# Patient Record
Sex: Male | Born: 2000 | Race: White | Hispanic: No | Marital: Single
Health system: Southern US, Community
[De-identification: ages and names within clinical notes are randomized; demographics above are authoritative.]

---
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
## 2021-02-23 ENCOUNTER — Other Ambulatory Visit: Payer: Self-pay

## 2021-02-23 MED ORDER — DOXYCYCLINE HYCLATE 100 MG PO CAPS
100.0000 mg | ORAL_CAPSULE | Freq: Two times a day (BID) | ORAL | 0 refills | Status: AC
  Filled 2021-02-23: qty 14, 7d supply, fill #0

## 2021-03-09 ENCOUNTER — Other Ambulatory Visit: Payer: Self-pay

## 2021-03-12 ENCOUNTER — Other Ambulatory Visit: Payer: Self-pay

## 2021-03-12 MED ORDER — CARESTART COVID-19 HOME TEST VI KIT
PACK | 0 refills | Status: AC
  Filled 2021-03-12: qty 2, 4d supply, fill #0

## 2021-03-25 ENCOUNTER — Other Ambulatory Visit: Payer: Self-pay

## 2021-10-15 ENCOUNTER — Other Ambulatory Visit: Payer: Self-pay

## 2021-10-15 DIAGNOSIS — Z5321 Procedure and treatment not carried out due to patient leaving prior to being seen by health care provider: Secondary | ICD-10-CM | POA: Insufficient documentation

## 2021-10-15 DIAGNOSIS — R109 Unspecified abdominal pain: Secondary | ICD-10-CM | POA: Diagnosis not present

## 2021-10-15 DIAGNOSIS — R197 Diarrhea, unspecified: Secondary | ICD-10-CM | POA: Diagnosis not present

## 2021-10-15 DIAGNOSIS — R1110 Vomiting, unspecified: Secondary | ICD-10-CM | POA: Insufficient documentation

## 2021-10-15 LAB — CBC
HCT: 43.2 % (ref 39.0–52.0)
Hemoglobin: 14.5 g/dL (ref 13.0–17.0)
MCH: 31.5 pg (ref 26.0–34.0)
MCHC: 33.6 g/dL (ref 30.0–36.0)
MCV: 93.9 fL (ref 80.0–100.0)
Platelets: 279 10*3/uL (ref 150–400)
RBC: 4.6 MIL/uL (ref 4.22–5.81)
RDW: 11.8 % (ref 11.5–15.5)
WBC: 15.3 10*3/uL — ABNORMAL HIGH (ref 4.0–10.5)
nRBC: 0 % (ref 0.0–0.2)

## 2021-10-15 LAB — COMPREHENSIVE METABOLIC PANEL
ALT: 17 U/L (ref 0–44)
AST: 23 U/L (ref 15–41)
Albumin: 5 g/dL (ref 3.5–5.0)
Alkaline Phosphatase: 74 U/L (ref 38–126)
Anion gap: 10 (ref 5–15)
BUN: 16 mg/dL (ref 6–20)
CO2: 26 mmol/L (ref 22–32)
Calcium: 9.7 mg/dL (ref 8.9–10.3)
Chloride: 104 mmol/L (ref 98–111)
Creatinine, Ser: 1.04 mg/dL (ref 0.61–1.24)
GFR, Estimated: >60 mL/min (ref >60)
Glucose, Bld: 150 mg/dL — ABNORMAL HIGH (ref 70–99)
Potassium: 3.5 mmol/L (ref 3.5–5.1)
Sodium: 140 mmol/L (ref 135–145)
Total Bilirubin: 0.9 mg/dL (ref 0.3–1.2)
Total Protein: 7.9 g/dL (ref 6.5–8.1)

## 2021-10-15 LAB — LIPASE, BLOOD: Lipase: 34 U/L (ref 11–51)

## 2021-10-15 NOTE — ED Notes (Addendum)
Pt mother to desk inquiring about wait time and stating pt needs to lie down. Advised that there are no available beds at this time and I am unable to provide and estimated wait time due to multiple moving factors determining when pts are seen. Mother then asks if he can be seen faster at [facility] This nurse advised that I am unable to provide circumstances or wait times at other facilities as well. Mother seen ambulatory out of dept.

## 2021-10-15 NOTE — ED Triage Notes (Signed)
Pt states generalized abd pain with vomiting, diarrhea that began approx 2 hours pta. Pt appears in no acute distress. Denies known fever.

## 2021-10-15 NOTE — ED Triage Notes (Signed)
Pt family member to desk stating pt is in bathroom vomiting. Advised family member once pt is triaged, need for medication would be evaluated and addressed. Family member verbalizes understanding

## 2021-10-16 ENCOUNTER — Emergency Department
Admission: EM | Admit: 2021-10-16 | Discharge: 2021-10-16 | Discharge status: Against Medical Advice | Payer: No Typology Code available for payment source | Attending: Student in an Organized Health Care Education/Training Program | Admitting: Student in an Organized Health Care Education/Training Program

## 2021-10-17 ENCOUNTER — Other Ambulatory Visit: Payer: Self-pay

## 2021-10-17 MED ORDER — ACETAMINOPHEN 325 MG PO TABS: 650.0000 mg | ORAL_TABLET | Freq: Four times a day (QID) | ORAL | 0 refills | Status: DC | PRN

## 2021-10-17 MED ORDER — IBUPROFEN 200 MG PO TABS: ORAL_TABLET | ORAL | 0 refills | Status: DC

## 2021-10-17 MED ORDER — OXYCODONE HCL 5 MG PO TABS
ORAL_TABLET | ORAL | 0 refills | Status: DC
  Filled 2021-10-17: qty 6, 2d supply, fill #0

## 2021-10-18 ENCOUNTER — Other Ambulatory Visit: Payer: Self-pay

## 2021-12-10 ENCOUNTER — Other Ambulatory Visit: Payer: Self-pay

## 2021-12-10 MED ORDER — DICLOFENAC SODIUM 75 MG PO TBEC
75.0000 mg | DELAYED_RELEASE_TABLET | Freq: Two times a day (BID) | ORAL | 0 refills | Status: AC
  Filled 2021-12-10: qty 60, 30d supply, fill #0

## 2022-08-03 DIAGNOSIS — Z23 Encounter for immunization: Secondary | ICD-10-CM | POA: Diagnosis not present
